# Patient Record
Sex: Female | Born: 1980 | Hispanic: No | Marital: Married | Smoking: Never smoker
Health system: Southern US, Community
[De-identification: ages and names within clinical notes are randomized; demographics above are authoritative.]

## PROBLEM LIST (undated history)

## (undated) DIAGNOSIS — Z789 Other specified health status: Secondary | ICD-10-CM

## (undated) HISTORY — PX: NO PAST SURGERIES: SHX2092

---

## 2013-04-13 NOTE — L&D Delivery Note (Signed)
Attestation of Attending Supervision of Advanced Practitioner (CNM/NP): Evaluation and management procedures were performed by the Advanced Practitioner under my supervision and collaboration. I have reviewed the Advanced Practitioner's note and chart, and I agree with the management and plan.  Creig Landin H Tinzley Dalia 7:28 AM   

## 2013-04-13 NOTE — L&D Delivery Note (Signed)
Delivery Note At 12:33 AM a viable female was delivered via Vaginal, Spontaneous Delivery (Presentation: LOA ).  APGAR: 8, 9; weight: pending. Nuchal x 2 with 1 loop easily reduced and the other, delivered through using 'somersault' maneuver. Infant dried and placed on pt's abd. Cord clamped and cut by FOB. Hospital cord blood sample collected.  Placenta status: Intact, Spontaneous.  Cord: 3 vessel.  Anesthesia: Epidural  Episiotomy: none Lacerations: none Est. Blood Loss (mL): 200  Mom to postpartum.  Baby to Couplet care / Skin to Skin.  Arabella MerlesKimberly D Kallum Jorgensen CNM 08/12/2013, 12:58 AM

## 2013-08-10 ENCOUNTER — Encounter (HOSPITAL_COMMUNITY): Payer: Self-pay

## 2013-08-10 ENCOUNTER — Inpatient Hospital Stay (HOSPITAL_COMMUNITY)
Admission: AD | Admit: 2013-08-10 | Discharge: 2013-08-13 | DRG: 775 | Disposition: A | Discharge status: Home / Self Care | Payer: Medicaid Other | Source: Ambulatory Visit | Attending: Obstetrics and Gynecology | Admitting: Obstetrics and Gynecology

## 2013-08-10 ENCOUNTER — Inpatient Hospital Stay (HOSPITAL_COMMUNITY): Payer: Medicaid Other

## 2013-08-10 HISTORY — DX: Other specified health status: Z78.9

## 2013-08-10 LAB — TYPE AND SCREEN
ABO/RH(D): O POS
ANTIBODY SCREEN: NEGATIVE

## 2013-08-10 LAB — DIFFERENTIAL
BASOS PCT: 0 % (ref 0–1)
Basophils Absolute: 0 10*3/uL (ref 0.0–0.1)
Eosinophils Absolute: 0.1 10*3/uL (ref 0.0–0.7)
Eosinophils Relative: 1 % (ref 0–5)
LYMPHS ABS: 2.4 10*3/uL (ref 0.7–4.0)
Lymphocytes Relative: 20 % (ref 12–46)
Monocytes Absolute: 0.8 10*3/uL (ref 0.1–1.0)
Monocytes Relative: 7 % (ref 3–12)
NEUTROS ABS: 8.8 10*3/uL — AB (ref 1.7–7.7)
NEUTROS PCT: 73 % (ref 43–77)

## 2013-08-10 LAB — CBC
HCT: 36.9 % (ref 36.0–46.0)
Hemoglobin: 12.6 g/dL (ref 12.0–15.0)
MCH: 29.4 pg (ref 26.0–34.0)
MCHC: 34.1 g/dL (ref 30.0–36.0)
MCV: 86.2 fL (ref 78.0–100.0)
Platelets: 183 10*3/uL (ref 150–400)
RBC: 4.28 MIL/uL (ref 3.87–5.11)
RDW: 13.5 % (ref 11.5–15.5)
WBC: 12.1 10*3/uL — ABNORMAL HIGH (ref 4.0–10.5)

## 2013-08-10 LAB — RAPID HIV SCREEN (WH-MAU): Rapid HIV Screen: NONREACTIVE

## 2013-08-10 LAB — WET PREP, GENITAL
CLUE CELLS WET PREP: NONE SEEN
Trich, Wet Prep: NONE SEEN
Yeast Wet Prep HPF POC: NONE SEEN

## 2013-08-10 LAB — AMNISURE RUPTURE OF MEMBRANE (ROM) NOT AT ARMC: AMNISURE: NEGATIVE

## 2013-08-10 LAB — OB RESULTS CONSOLE GC/CHLAMYDIA
CHLAMYDIA, DNA PROBE: NEGATIVE
Gonorrhea: NEGATIVE

## 2013-08-10 LAB — OB RESULTS CONSOLE GBS: STREP GROUP B AG: NEGATIVE

## 2013-08-10 LAB — GROUP B STREP BY PCR: Group B strep by PCR: NEGATIVE

## 2013-08-10 LAB — OB RESULTS CONSOLE HIV ANTIBODY (ROUTINE TESTING): HIV: NONREACTIVE

## 2013-08-10 LAB — RPR

## 2013-08-10 LAB — ABO/RH: ABO/RH(D): O POS

## 2013-08-10 LAB — HEPATITIS B SURFACE ANTIGEN: HEP B S AG: NEGATIVE

## 2013-08-10 MED ORDER — ONDANSETRON HCL 4 MG/2ML IJ SOLN
4.0000 mg | Freq: Four times a day (QID) | INTRAMUSCULAR | Status: DC | PRN
Start: 2013-08-10 — End: 2013-08-12
  Administered 2013-08-11: 4 mg via INTRAVENOUS
  Filled 2013-08-10: qty 2

## 2013-08-10 MED ORDER — FENTANYL CITRATE 0.05 MG/ML IJ SOLN
50.0000 ug | INTRAMUSCULAR | Status: DC | PRN
  Administered 2013-08-10: 50 ug via INTRAVENOUS
  Filled 2013-08-10: qty 2

## 2013-08-10 MED ORDER — TERBUTALINE SULFATE 1 MG/ML IJ SOLN: 0.2500 mg | Freq: Once | INTRAMUSCULAR | Status: AC | PRN

## 2013-08-10 MED ORDER — ACETAMINOPHEN 325 MG PO TABS: 650.0000 mg | ORAL_TABLET | ORAL | Status: DC | PRN

## 2013-08-10 MED ORDER — LIDOCAINE HCL (PF) 1 % IJ SOLN
30.0000 mL | INTRAMUSCULAR | Status: DC | PRN
  Filled 2013-08-10: qty 30

## 2013-08-10 MED ORDER — OXYTOCIN BOLUS FROM INFUSION: 500.0000 mL | INTRAVENOUS | Status: DC

## 2013-08-10 MED ORDER — FLEET ENEMA 7-19 GM/118ML RE ENEM
1.0000 | ENEMA | RECTAL | Status: DC | PRN
Start: 2013-08-10 — End: 2013-08-12

## 2013-08-10 MED ORDER — IBUPROFEN 600 MG PO TABS: 600.0000 mg | ORAL_TABLET | Freq: Four times a day (QID) | ORAL | Status: DC | PRN

## 2013-08-10 MED ORDER — ZOLPIDEM TARTRATE 5 MG PO TABS: 5.0000 mg | ORAL_TABLET | Freq: Every evening | ORAL | Status: DC | PRN

## 2013-08-10 MED ORDER — DIPHENHYDRAMINE HCL 50 MG/ML IJ SOLN: 12.5000 mg | INTRAMUSCULAR | Status: DC | PRN

## 2013-08-10 MED ORDER — LACTATED RINGERS IV SOLN
500.0000 mL | INTRAVENOUS | Status: DC | PRN
  Administered 2013-08-11 (×2): 500 mL via INTRAVENOUS

## 2013-08-10 MED ORDER — OXYTOCIN 40 UNITS IN LACTATED RINGERS INFUSION - SIMPLE MED
62.5000 mL/h | INTRAVENOUS | Status: DC
  Filled 2013-08-10: qty 1000

## 2013-08-10 MED ORDER — EPHEDRINE 5 MG/ML INJ
10.0000 mg | INTRAVENOUS | Status: DC | PRN
  Filled 2013-08-10: qty 2
  Filled 2013-08-10: qty 4

## 2013-08-10 MED ORDER — OXYCODONE-ACETAMINOPHEN 5-325 MG PO TABS: 1.0000 | ORAL_TABLET | ORAL | Status: DC | PRN

## 2013-08-10 MED ORDER — PHENYLEPHRINE 40 MCG/ML (10ML) SYRINGE FOR IV PUSH (FOR BLOOD PRESSURE SUPPORT)
80.0000 ug | PREFILLED_SYRINGE | INTRAVENOUS | Status: DC | PRN
  Filled 2013-08-10: qty 2

## 2013-08-10 MED ORDER — CITRIC ACID-SODIUM CITRATE 334-500 MG/5ML PO SOLN
30.0000 mL | ORAL | Status: DC | PRN
  Filled 2013-08-10: qty 15

## 2013-08-10 MED ORDER — LACTATED RINGERS IV SOLN
INTRAVENOUS | Status: DC
  Administered 2013-08-10 – 2013-08-11 (×3): via INTRAVENOUS

## 2013-08-10 MED ORDER — EPHEDRINE 5 MG/ML INJ
10.0000 mg | INTRAVENOUS | Status: DC | PRN
  Filled 2013-08-10: qty 4
  Filled 2013-08-10: qty 2

## 2013-08-10 MED ORDER — LACTATED RINGERS IV SOLN: 500.0000 mL | Freq: Once | INTRAVENOUS | Status: DC

## 2013-08-10 MED ORDER — FENTANYL 2.5 MCG/ML BUPIVACAINE 1/10 % EPIDURAL INFUSION (WH - ANES)
14.0000 mL/h | INTRAMUSCULAR | Status: DC | PRN
  Administered 2013-08-11: 14 mL/h via EPIDURAL
  Filled 2013-08-10 (×2): qty 125

## 2013-08-10 MED ORDER — PHENYLEPHRINE 40 MCG/ML (10ML) SYRINGE FOR IV PUSH (FOR BLOOD PRESSURE SUPPORT)
80.0000 ug | PREFILLED_SYRINGE | INTRAVENOUS | Status: DC | PRN
  Filled 2013-08-10: qty 10
  Filled 2013-08-10: qty 2

## 2013-08-10 NOTE — Progress Notes (Signed)
   Christine KassHebah Sheppard is a 33 y.o. G1P0 at 4638w2d  admitted for induction of labor due to several fetal decels, double nuchal cord.  Subjective:  Not feeling any contractions Objective: BP 128/75  Pulse 72  Temp(Src) 98 F (36.7 C) (Oral)  Resp 18  Ht 5' 3.39" (1.61 m)  Wt 76.658 kg (169 lb)  BMI 29.57 kg/m2  SpO2 100%    FHT:  FHR: 130 bpm, variability: moderate,  accelerations:  Present,  decelerations:  Absent UC:   irregular, every 2-5 minutes SVE:   Dilation: 1 Effacement (%): Thick Station: -2 Exam by:: fran cresenzio cnm  US today gives GA of 35.2 weeks; however, pt is "very, very sure" of LMP and states that she was given an EDC of May 5-20 in [place], so will not manage as if preterm.  Labs: Lab Results  Component Value Date   WBC 12.1* 08/10/2013   HGB 12.6 08/10/2013   HCT 36.9 08/10/2013   MCV 86.2 08/10/2013   PLT 183 08/10/2013    Assessment / Plan: IOL for 2 prolonged bradycardic episodes GBS pending FOLEY inserted and inflated with 60cc H20 Labor: ripening phase Fetal Wellbeing:  Category I Pain Control:  Labor support without medications Anticipated MOD:  NSVD  Christine ShellFrances Sheppard 08/10/2013, 10:06 PM

## 2013-08-10 NOTE — MAU Provider Note (Signed)
None     Chief Complaint:  No chief complaint on file.   Christine Sheppard is  33 y.o. G1P0 at 345w2d presents complaining of vaginal leakage, and increased fetal movement this morning. Pt states that she was evaluated at [facility] for Vaginal bleeding 10d ago but has not had bleeding since that time. No significant contractions. While on monitor in MAU pt had a prolonged decel to 80s that slowly recovered with fluids and O2.  Of note pt states that her LMP is Nov 15 2012 and has received her prenatal care in [place]. Pt does not have any of her prenatal records. Pt reports a normal prenatal course and is only taking PNV.   Obstetrical/Gynecological History: OB History   Grav Para Term Preterm Abortions TAB SAB Ect Mult Living   1              Past Medical History: Past Medical History  Diagnosis Date  . Medical history non-contributory     Past Surgical History: Past Surgical History  Procedure Laterality Date  . No past surgeries      Family History: Family History  Problem Relation Age of Onset  . Alcohol abuse Neg Hx   . Arthritis Neg Hx   . Asthma Neg Hx   . Birth defects Neg Hx   . Cancer Neg Hx   . COPD Neg Hx   . Depression Neg Hx   . Diabetes Neg Hx   . Drug abuse Neg Hx   . Early death Neg Hx   . Hearing loss Neg Hx   . Heart disease Neg Hx   . Hyperlipidemia Neg Hx   . Hypertension Neg Hx   . Kidney disease Neg Hx   . Learning disabilities Neg Hx   . Mental illness Neg Hx   . Mental retardation Neg Hx   . Miscarriages / Stillbirths Neg Hx   . Stroke Neg Hx   . Vision loss Neg Hx     Social History: History  Substance Use Topics  . Smoking status: Never Smoker   . Smokeless tobacco: Not on file  . Alcohol Use: No    Allergies: No Known Allergies  Meds:  Prescriptions prior to admission  Medication Sig Dispense Refill  . Prenatal Vit-Fe Fumarate-FA (PRENATAL MULTIVITAMIN) TABS tablet Take 1 tablet by mouth daily at 12 noon.        Review of  Systems -   Review of Systems  Denies HA, vision changes, CP, SOB, n/v, d/c, f/c, or other complaints.      Physical Exam  Blood pressure 128/75, pulse 72, temperature 98 F (36.7 C), temperature source Oral, resp. rate 18, height 5' 3.39" (1.61 m), weight 76.658 kg (169 lb), SpO2 100.00%. GENERAL: Well-developed, well-nourished female in no acute distress.  LUNGS: Clear to auscultation bilaterally.  HEART: Regular rate and rhythm. ABDOMEN: Soft, nontender, nondistended, gravid.  EXTREMITIES: Nontender, no edema, 2+ distal pulses. DTR's 2+  Dilation: 1 Effacement (%): Thick Cervical Position: Posterior Station: -2 Presentation: Vertex Exam by:: fran cresenzio cnm  SSE: White vag Discharge  Presentation: cephalic FHT:  Baseline rate 150s bpm   Variability moderate  Accelerations present   Decelerations one prolonged decel. Contractions: Every  mins   Labs: Results for orders placed during the hospital encounter of 08/10/13 (from the past 24 hour(s))  WET PREP, GENITAL   Collection Time    08/10/13  3:59 PM      Result Value Ref Range  Yeast Wet Prep HPF POC NONE SEEN  NONE SEEN   Trich, Wet Prep NONE SEEN  NONE SEEN   Clue Cells Wet Prep HPF POC NONE SEEN  NONE SEEN   WBC, Wet Prep HPF POC FEW (*) NONE SEEN  HEPATITIS B SURFACE ANTIGEN   Collection Time    08/10/13  4:10 PM      Result Value Ref Range   Hepatitis B Surface Ag NEGATIVE  NEGATIVE  RPR   Collection Time    08/10/13  4:10 PM      Result Value Ref Range   RPR NON REAC  NON REAC  CBC   Collection Time    08/10/13  4:10 PM      Result Value Ref Range   WBC 12.1 (*) 4.0 - 10.5 K/uL   RBC 4.28  3.87 - 5.11 MIL/uL   Hemoglobin 12.6  12.0 - 15.0 g/dL   HCT 54.036.9  98.136.0 - 19.146.0 %   MCV 86.2  78.0 - 100.0 fL   MCH 29.4  26.0 - 34.0 pg   MCHC 34.1  30.0 - 36.0 g/dL   RDW 47.813.5  29.511.5 - 62.115.5 %   Platelets 183  150 - 400 K/uL  DIFFERENTIAL   Collection Time    08/10/13  4:10 PM      Result Value Ref Range    Neutrophils Relative % 73  43 - 77 %   Neutro Abs 8.8 (*) 1.7 - 7.7 K/uL   Lymphocytes Relative 20  12 - 46 %   Lymphs Abs 2.4  0.7 - 4.0 K/uL   Monocytes Relative 7  3 - 12 %   Monocytes Absolute 0.8  0.1 - 1.0 K/uL   Eosinophils Relative 1  0 - 5 %   Eosinophils Absolute 0.1  0.0 - 0.7 K/uL   Basophils Relative 0  0 - 1 %   Basophils Absolute 0.0  0.0 - 0.1 K/uL  RAPID HIV SCREEN [facility])   Collection Time    08/10/13  4:10 PM      Result Value Ref Range   SUDS Rapid HIV Screen NON REACTIVE  NON REACTIVE  AMNISURE RUPTURE OF MEMBRANE (ROM)   Collection Time    08/10/13  4:22 PM      Result Value Ref Range   Amnisure ROM NEGATIVE    TYPE AND SCREEN   Collection Time    08/10/13  5:30 PM      Result Value Ref Range   ABO/RH(D) O POS     Antibody Screen NEG     Sample Expiration 08/13/2013    ABO/RH   Collection Time    08/10/13  5:30 PM      Result Value Ref Range   ABO/RH(D) O POS     Imaging Studies:  No results found.  Assessment: Christine Sheppard is  33 y.o. G1P0 at 2758w2d presents with eval for ROM and increased Fetal movement.  - Neg ROM - Prolonged decel  -- BPP Pending - PNL collected, no prenatal care in [place]. -- Message sent to clinic to establish care.  Pt signed out to oncoming provider at 1700.  Scarlette CalicoFrances Cresenzo-Dishmon 4/30/201510:15 PM  Cord dopplers normal and BPP still unavailable.  However, pt had another 10 minute deceleration of the FHR. Dr. Penne LashLeggett responded and advised IOL. PT agrees.

## 2013-08-10 NOTE — MAU Note (Signed)
Pt states she has been feeling the baby move more than usual

## 2013-08-10 NOTE — Progress Notes (Signed)
This note also relates to the following rows which could not be included: Pulse Rate - Cannot attach notes to unvalidated device data SpO2 - Cannot attach notes to unvalidated device data   Pt had deceleration down to 50 bpm recovering after to 130 bpm

## 2013-08-10 NOTE — MAU Note (Signed)
Patient states she has recently (2 weeks ago) flown from [place]. States that during that flight she has some bleeding and leaking fluid and was checked by a MD on the flight and was determined to be OK. States she was advised to not return to try to deliver. States she will deliver here. States she feels the baby is moving to much today. No pain, bleeding or leaking today.

## 2013-08-10 NOTE — Progress Notes (Addendum)
This note also relates to the following rows which could not be included: Pulse Rate - Cannot attach notes to unvalidated device data SpO2 - Cannot attach notes to unvalidated device data   Deceleration noted Dr Ike Benedom called to the room pt turned to her side O2  Infusing IV started. Dr. Penne LashLeggett also at bedside Pulse  Oximeter in place.

## 2013-08-10 NOTE — Progress Notes (Signed)
This note also relates to the following rows which could not be included: Pulse Rate - Cannot attach notes to unvalidated device data SpO2 - Cannot attach notes to unvalidated device data   Pt had a deceleration to  50  O2 masked replaced and O2 bolus started   Pt turned to left side and then to right side pt placed in knee chest position FHR returned   back to baseline 120   After 6 minutes.

## 2013-08-10 NOTE — Progress Notes (Signed)
Updated Drenda FreezeFran CNM about mild UC pattern, room ready for foley bulb insertion, GBS needs to be drawn.

## 2013-08-11 ENCOUNTER — Encounter (HOSPITAL_COMMUNITY): Payer: Self-pay | Admitting: *Deleted

## 2013-08-11 ENCOUNTER — Encounter (HOSPITAL_COMMUNITY): Payer: Medicaid Other | Admitting: Anesthesiology

## 2013-08-11 ENCOUNTER — Inpatient Hospital Stay (HOSPITAL_COMMUNITY): Payer: Medicaid Other | Admitting: Anesthesiology

## 2013-08-11 LAB — RUBELLA SCREEN: Rubella: 0.81 Index (ref <0.90)

## 2013-08-11 LAB — GC/CHLAMYDIA PROBE AMP
CT PROBE, AMP APTIMA: NEGATIVE
GC PROBE AMP APTIMA: NEGATIVE

## 2013-08-11 MED ORDER — LIDOCAINE HCL (PF) 1 % IJ SOLN
INTRAMUSCULAR | Status: DC | PRN
  Administered 2013-08-11 (×2): 4 mL

## 2013-08-11 MED ORDER — TERBUTALINE SULFATE 1 MG/ML IJ SOLN: 0.2500 mg | Freq: Once | INTRAMUSCULAR | Status: AC | PRN

## 2013-08-11 MED ORDER — SODIUM CHLORIDE 0.9 % IV SOLN: 14.0000 mL/h | INTRAVENOUS | Status: DC | PRN

## 2013-08-11 MED ORDER — LACTATED RINGERS IV SOLN: INTRAVENOUS | Status: DC

## 2013-08-11 MED ORDER — OXYTOCIN 40 UNITS IN LACTATED RINGERS INFUSION - SIMPLE MED
1.0000 m[IU]/min | INTRAVENOUS | Status: DC
  Administered 2013-08-11: 2 m[IU]/min via INTRAVENOUS

## 2013-08-11 MED ORDER — FENTANYL 2.5 MCG/ML BUPIVACAINE 1/10 % EPIDURAL INFUSION (WH - ANES)
INTRAMUSCULAR | Status: DC | PRN
  Administered 2013-08-11: 14 mL/h via EPIDURAL

## 2013-08-11 NOTE — Anesthesia Procedure Notes (Signed)
Epidural Patient location during procedure: OB Start time: 08/11/2013 11:00 AM  Staffing Anesthesiologist: Mallie Giambra A. Performed by: anesthesiologist   Preanesthetic Checklist Completed: patient identified, site marked, surgical consent, pre-op evaluation, timeout performed, IV checked, risks and benefits discussed and monitors and equipment checked  Epidural Patient position: left lateral decubitus Prep: site prepped and draped and DuraPrep Patient monitoring: continuous pulse ox and blood pressure Approach: midline Location: L4-L5 Injection technique: LOR air  Needle:  Needle type: Tuohy  Needle gauge: 17 G Needle length: 9 cm and 9 Needle insertion depth: 6 cm Catheter type: closed end flexible Catheter size: 19 Gauge Catheter at skin depth: 11 cm Test dose: negative and Other  Assessment Events: blood not aspirated, injection not painful, no injection resistance, negative IV test and no paresthesia  Additional Notes Patient identified. Risks and benefits discussed including failed block, incomplete  Pain control, post dural puncture headache, nerve damage, paralysis, blood pressure Changes, nausea, vomiting, reactions to medications-both toxic and allergic and post Partum back pain. All questions were answered. Patient expressed understanding and wished to proceed. Sterile technique was used throughout procedure. Epidural site was Dressed with sterile barrier dressing. No paresthesias, signs of intravascular injection Or signs of intrathecal spread were encountered.  Patient was more comfortable after the epidural was dosed. Please see RN's note for documentation of vital signs and FHR which are stable.

## 2013-08-11 NOTE — H&P (Signed)
Author: [name], CNM Service: Obstetrics Author Type: Certified Nurse Midwife      Filed: 08/10/2013 10:15 PM Note Time: 08/10/2013  4:09 PM Note Type: MAU Provider Note      Status: Cosign Needed Editor: [name], CNM (Certified Nurse Midwife)      Related Notes: Original Note by Minta Balsam, MD (Physician) filed at 08/10/2013  5:00 PM      Cosign Required: Yes               None       Chief Complaint:  No chief complaint on file.     Christine Sheppard is  33 y.o. G1P0 at [redacted]w[redacted]d presents complaining of vaginal leakage, and increased fetal movement this morning. Pt states that she was evaluated at [facility] for Vaginal bleeding 10d ago but has not had bleeding since that time. No significant contractions. While on monitor in MAU pt had a prolonged decel to 80s that slowly recovered with fluids and O2.   Of note pt states that her LMP is Nov 15 2012 and has received her prenatal care in [place]. Pt does not have any of her prenatal records. Pt reports a normal prenatal course and is only taking PNV.    Obstetrical/Gynecological History: OB History     Grav  Para  Term  Preterm  Abortions  TAB  SAB  Ect  Mult  Living     1                           Past Medical History: Past Medical History   Diagnosis  Date   .  Medical history non-contributory          Past Surgical History: Past Surgical History   Procedure  Laterality  Date   .  No past surgeries            Family History: Family History   Problem  Relation  Age of Onset   .  Alcohol abuse  Neg Hx     .  Arthritis  Neg Hx     .  Asthma  Neg Hx     .  Birth defects  Neg Hx     .  Cancer  Neg Hx     .  COPD  Neg Hx     .  Depression  Neg Hx     .  Diabetes  Neg Hx     .  Drug abuse  Neg Hx     .  Early death  Neg Hx     .  Hearing loss  Neg Hx     .  Heart disease  Neg Hx     .  Hyperlipidemia  Neg Hx     .  Hypertension  Neg Hx     .  Kidney disease  Neg Hx     .  Learning  disabilities  Neg Hx     .  Mental illness  Neg Hx     .  Mental retardation  Neg Hx     .  Miscarriages / Stillbirths  Neg Hx     .  Stroke  Neg Hx     .  Vision loss  Neg Hx          Social History: History   Substance Use Topics   .  Smoking status:  Never Smoker    .  Smokeless tobacco:  Not on file   .  Alcohol Use:  No        Allergies: No Known Allergies   Meds:   Prescriptions prior to admission   Medication  Sig  Dispense  Refill   .  Prenatal Vit-Fe Fumarate-FA (PRENATAL MULTIVITAMIN) TABS tablet  Take 1 tablet by mouth daily at 12 noon.              Review of Systems -    Review of Systems  Denies HA, vision changes, CP, SOB, n/v, d/c, f/c, or other complaints.          Physical Exam    Blood pressure 128/75, pulse 72, temperature 98 F (36.7 C), temperature source Oral, resp. rate 18, height 5' 3.39" (1.61 m), weight 76.658 kg (169 lb), SpO2 100.00%. GENERAL: Well-developed, well-nourished female in no acute distress.   LUNGS: Clear to auscultation bilaterally.   HEART: Regular rate and rhythm. ABDOMEN: Soft, nontender, nondistended, gravid.   EXTREMITIES: Nontender, no edema, 2+ distal pulses. DTR's 2+   Dilation: 1 Effacement (%): Thick Cervical Position: Posterior Station: -2 Presentation: Vertex Exam by:: fran cresenzio cnm   SSE: White vag Discharge   Presentation: cephalic FHT:  Baseline rate 150s bpm   Variability moderate  Accelerations present   Decelerations one prolonged decel. Contractions: Every  mins    Labs: Results for orders placed during the hospital encounter of 08/10/13 (from the past 24 hour(s))   WET PREP, GENITAL     Collection Time      08/10/13  3:59 PM       Result  Value  Ref Range     Yeast Wet Prep HPF POC  NONE SEEN   NONE SEEN     Trich, Wet Prep  NONE SEEN   NONE SEEN     Clue Cells Wet Prep HPF POC  NONE SEEN   NONE SEEN     WBC, Wet Prep HPF POC  FEW (*)  NONE SEEN   HEPATITIS B SURFACE  ANTIGEN     Collection Time      08/10/13  4:10 PM       Result  Value  Ref Range     Hepatitis B Surface Ag  NEGATIVE   NEGATIVE   RPR     Collection Time      08/10/13  4:10 PM       Result  Value  Ref Range     RPR  NON REAC   NON REAC   CBC     Collection Time      08/10/13  4:10 PM       Result  Value  Ref Range     WBC  12.1 (*)  4.0 - 10.5 K/uL     RBC  4.28   3.87 - 5.11 MIL/uL     Hemoglobin  12.6   12.0 - 15.0 g/dL     HCT  16.136.9   09.636.0 - 46.0 %     MCV  86.2   78.0 - 100.0 fL     MCH  29.4   26.0 - 34.0 pg     MCHC  34.1   30.0 - 36.0 g/dL     RDW  04.513.5   40.911.5 - 15.5 %     Platelets  183   150 - 400 K/uL   DIFFERENTIAL     Collection Time      08/10/13  4:10 PM  Result  Value  Ref Range     Neutrophils Relative %  73   43 - 77 %     Neutro Abs  8.8 (*)  1.7 - 7.7 K/uL     Lymphocytes Relative  20   12 - 46 %     Lymphs Abs  2.4   0.7 - 4.0 K/uL     Monocytes Relative  7   3 - 12 %     Monocytes Absolute  0.8   0.1 - 1.0 K/uL     Eosinophils Relative  1   0 - 5 %     Eosinophils Absolute  0.1   0.0 - 0.7 K/uL     Basophils Relative  0   0 - 1 %     Basophils Absolute  0.0   0.0 - 0.1 K/uL   RAPID HIV SCREEN [facility])     Collection Time      08/10/13  4:10 PM       Result  Value  Ref Range     SUDS Rapid HIV Screen  NON REACTIVE   NON REACTIVE   AMNISURE RUPTURE OF MEMBRANE (ROM)     Collection Time      08/10/13  4:22 PM       Result  Value  Ref Range     Amnisure ROM  NEGATIVE      TYPE AND SCREEN     Collection Time      08/10/13  5:30 PM       Result  Value  Ref Range     ABO/RH(D)  O POS        Antibody Screen  NEG        Sample Expiration  08/13/2013      ABO/RH     Collection Time      08/10/13  5:30 PM       Result  Value  Ref Range     ABO/RH(D)  O POS         Imaging Studies:  No results found.   Assessment: Christine Sheppard is  33 y.o. G1P0 at 5234w2d presents with eval for ROM and increased Fetal movement.   - Neg ROM - Prolonged  decel   -- BPP Pending - PNL collected, no prenatal care in [place]. -- Message sent to clinic to establish care.   Pt signed out to oncoming provider at 1700.   Scarlette CalicoFrances Cresenzo-Dishmon 4/30/201510:15 PM   Cord dopplers normal and BPP still unavailable.  However, pt had another 10 minute deceleration of the FHR. Dr. Penne LashLeggett responded and advised IOL. PT agrees.    Pt seen and examined.  Agree with above. Lesly DukesKelly H Leggett

## 2013-08-11 NOTE — Progress Notes (Signed)
   Christine KassHebah Sheppard is a 33 y.o. G1P0 at 3954w2d  admitted for induction of labor due to several fetal decels  Subjective: Comfortable s/p epidural. Does not feel contractions. Pleasant without questions.  Objective: BP 111/71  Pulse 85  Temp(Src) 98.2 F (36.8 C) (Oral)  Resp 18  Ht 5' 3.39" (1.61 m)  Wt 76.658 kg (169 lb)  BMI 29.57 kg/m2  SpO2 100%  LMP 11/15/2012     FHT:  FHR: 140 bpm, variability: moderate,  accelerations:  Present,  decelerations:  Prolonged decel (1040 to 1046) fetal brady down to 60, return to baseline 140 (hands / knee position, O2, IV bolus) UC:   Regular q 2 min SVE: Dilation: 5 Effacement (%): 60 Cervical Position: Middle Station: 0 Presentation: Vertex Exam by:: Felkel,rn (Dr. Althea CharonKaramalegos)   Labs: Lab Results  Component Value Date   WBC 12.1* 08/10/2013   HGB 12.6 08/10/2013   HCT 36.9 08/10/2013   MCV 86.2 08/10/2013   PLT 183 08/10/2013    Assessment / Plan: IOL for 2 prolonged bradycardic episodes / decels - 3rd prolonged decel today 1040 to 1046, resolved with hands-knees position, IV bolus, supplemental O2.  Labor: IOL: s/p FB, progressing well on Pitocin currently on 6 milu/min. Unlikely AROM until significant progression, due to benefit of fluid protecting cord with significant decels. Fetal Wellbeing:  Category I Pain Control:  Epidural Anticipated MOD:  NSVD  Christine PilarAlexander Karamalegos, Christine Sheppard [facility], PGY-1 08/11/2013, 3:47 PM  I reviewed and agree with above documentation.    Christine HavenKeli L Ja Pistole, MD

## 2013-08-11 NOTE — Progress Notes (Signed)
   Christine Sheppard is a 33 y.o. G1P0 at 284w2d  admitted for induction of labor due to several fetal decels  Subjective: Feeling fine, not feeling contractions Objective: BP 97/51  Pulse 85  Temp(Src) 98.1 F (36.7 C) (Oral)  Resp 18  Ht 5' 3.39" (1.61 m)  Wt 76.658 kg (169 lb)  BMI 29.57 kg/m2  SpO2 100%  LMP 11/15/2012     FHT:  FHR: 130 bpm, variability: moderate,  accelerations:  Present,  decelerations:  Absent UC:   irregular, every 2-5 minutes SVE:  Deferred: FB stil in place  Labs: Lab Results  Component Value Date   WBC 12.1* 08/10/2013   HGB 12.6 08/10/2013   HCT 36.9 08/10/2013   MCV 86.2 08/10/2013   PLT 183 08/10/2013    Assessment / Plan: IOL for 2 prolonged bradycardic episodes Will allow a light breakfast/shower Labor: ripening phase Fetal Wellbeing:  Category I Pain Control:  Labor support without medications Anticipated MOD:  NSVD  Christine ShellFrances Sheppard 08/11/2013, 7:29 AM

## 2013-08-11 NOTE — MAU Provider Note (Signed)
Attestation of Attending Supervision of Advanced Practitioner (CNM/NP): Evaluation and management procedures were performed by the Advanced Practitioner under my supervision and collaboration. I have reviewed the Advanced Practitioner's note and chart, and I agree with the management and plan.  Lesly DukesKelly H Jonerik Sliker 6:28 AM

## 2013-08-11 NOTE — Progress Notes (Signed)
Christine Sheppard is a 33 y.o. G1P0 at 3122w3d   Subjective: Comfortable with epidural; had mod early onset variables starting a little before 8p for which amnioinfusion was started  Objective: BP 110/57  Pulse 92  Temp(Src) 98.5 F (36.9 C) (Oral)  Resp 18  Ht 5' 3.39" (1.61 m)  Wt 76.658 kg (169 lb)  BMI 29.57 kg/m2  SpO2 100%  LMP 11/15/2012 I/O last 3 completed shifts: In: -  Out: 1200 [Urine:1200]    FHT:  FHR: 130s bpm, variability: moderate,  accelerations:  Present,  decelerations:  Absent UC:   regular, every 2-4 minutes without Pitocin; MVUs adequate SVE:  7/90/0- exam deferred at present Labs: Lab Results  Component Value Date   WBC 12.1* 08/10/2013   HGB 12.6 08/10/2013   HCT 36.9 08/10/2013   MCV 86.2 08/10/2013   PLT 183 08/10/2013    Assessment / Plan: Active labor FHR changes but stable now  Will continue amnioinfusion and watch FHR closely- suspect FHR changes were due to rapid cx change/descent Anticipate SVD  Christine Sheppard 08/11/2013, 9:47 PM

## 2013-08-11 NOTE — Progress Notes (Signed)
   Kerry KassHebah Fennel is a 33 y.o. G1P0 at 2748w2d  admitted for induction of labor due to several fetal decels, double nuchal cord.  Subjective: Sleeping  Objective: BP 97/51  Pulse 85  Temp(Src) 98.1 F (36.7 C) (Oral)  Resp 18  Ht 5' 3.39" (1.61 m)  Wt 76.658 kg (169 lb)  BMI 29.57 kg/m2  SpO2 100%  LMP 11/15/2012   GBS negative  FHT:  FHR: 130 bpm, variability: moderate,  accelerations:  Present,  decelerations:  Absent UC:   irregular, every 2-5 minutes SVE:  Deferred: FB stil in place  Labs: Lab Results  Component Value Date   WBC 12.1* 08/10/2013   HGB 12.6 08/10/2013   HCT 36.9 08/10/2013   MCV 86.2 08/10/2013   PLT 183 08/10/2013    Assessment / Plan: IOL for 2 prolonged bradycardic episodes  Labor: ripening phase Fetal Wellbeing:  Category I Pain Control:  Labor support without medications Anticipated MOD:  NSVD  Jacklyn ShellFrances Cresenzo-Dishmon 08/11/2013, 4:43 AM

## 2013-08-11 NOTE — Progress Notes (Signed)
Christine Sheppard is a 33 y.o. G1P0 at 3893w3d admitted for induction of labor due to decels.  Subjective: Comfortable with epidural. No complaints. +FM.    Objective: BP 111/61  Pulse 88  Temp(Src) 98.2 F (36.8 C) (Oral)  Resp 20  Ht 5' 3.39" (1.61 m)  Wt 76.658 kg (169 lb)  BMI 29.57 kg/m2  SpO2 100%  LMP 11/15/2012 I/O last 3 completed shifts: In: -  Out: 1200 [Urine:1200]    FHT:  FHR: 125 bpm, variability: moderate,  accelerations:  Present,  decelerations:  Absent UC:   irregular, every 2-4 minutes SVE:   Dilation: 4.5 Effacement (%): 60 Station: 0 Exam by:: dr. Reola Sheppard  Labs: Lab Results  Component Value Date   WBC 12.1* 08/10/2013   HGB 12.6 08/10/2013   HCT 36.9 08/10/2013   MCV 86.2 08/10/2013   PLT 183 08/10/2013    Assessment / Plan: IOL for decels. slow progression since fb came out.    Labor: no real change on exam for me. AROM performed with return of clear fluid. IUPC placed in case we need to do an amnioinfusion.  Initially baby with deep variables with contractions but also MVUs >300 and after decreasing pit to 635mu/min marked improvement in fetal status.  Fetal Wellbeing:  cat I prior to AROM and then cat II with rapid improvement to cat I after decreasing pitocin Pain Control:  Epidural I/D:  n/a Anticipated MOD:  NSVD  Christine Sheppard Christine Sheppard 08/11/2013, 7:31 PM

## 2013-08-11 NOTE — Anesthesia Preprocedure Evaluation (Addendum)
Anesthesia Evaluation  Patient identified by MRN, date of birth, ID band Patient awake    Reviewed: Allergy & Precautions, H&P , NPO status , Patient's Chart, lab work & pertinent test results  Airway Mallampati: III TM Distance: >3 FB Neck ROM: Full    Dental no notable dental hx. (+) Teeth Intact   Pulmonary neg pulmonary ROS,  breath sounds clear to auscultation  Pulmonary exam normal       Cardiovascular negative cardio ROS  Rhythm:Regular Rate:Normal     Neuro/Psych negative neurological ROS  negative psych ROS   GI/Hepatic negative GI ROS, Neg liver ROS,   Endo/Other  negative endocrine ROS  Renal/GU negative Renal ROS  negative genitourinary   Musculoskeletal negative musculoskeletal ROS (+)   Abdominal   Peds  Hematology negative hematology ROS (+)   Anesthesia Other Findings   Reproductive/Obstetrics (+) Pregnancy Double nuchal cord                          Anesthesia Physical Anesthesia Plan  ASA: II  Anesthesia Plan: Epidural   Post-op Pain Management:    Induction:   Airway Management Planned: Natural Airway  Additional Equipment:   Intra-op Plan:   Post-operative Plan:   Informed Consent: I have reviewed the patients History and Physical, chart, labs and discussed the procedure including the risks, benefits and alternatives for the proposed anesthesia with the patient or authorized representative who has indicated his/her understanding and acceptance.     Plan Discussed with: Anesthesiologist  Anesthesia Plan Comments:         Anesthesia Quick Evaluation

## 2013-08-12 ENCOUNTER — Encounter (HOSPITAL_COMMUNITY): Payer: Self-pay | Admitting: *Deleted

## 2013-08-12 DIAGNOSIS — O368390 Maternal care for abnormalities of the fetal heart rate or rhythm, unspecified trimester, not applicable or unspecified: Secondary | ICD-10-CM

## 2013-08-12 MED ORDER — ZOLPIDEM TARTRATE 5 MG PO TABS: 5.0000 mg | ORAL_TABLET | Freq: Every evening | ORAL | Status: DC | PRN

## 2013-08-12 MED ORDER — DIPHENHYDRAMINE HCL 25 MG PO CAPS: 25.0000 mg | ORAL_CAPSULE | Freq: Four times a day (QID) | ORAL | Status: DC | PRN

## 2013-08-12 MED ORDER — PRENATAL MULTIVITAMIN CH
1.0000 | ORAL_TABLET | Freq: Every day | ORAL | Status: DC
  Administered 2013-08-12 – 2013-08-13 (×2): 1 via ORAL
  Filled 2013-08-12 (×2): qty 1

## 2013-08-12 MED ORDER — ONDANSETRON HCL 4 MG/2ML IJ SOLN: 4.0000 mg | INTRAMUSCULAR | Status: DC | PRN

## 2013-08-12 MED ORDER — ONDANSETRON HCL 4 MG PO TABS: 4.0000 mg | ORAL_TABLET | ORAL | Status: DC | PRN

## 2013-08-12 MED ORDER — SENNOSIDES-DOCUSATE SODIUM 8.6-50 MG PO TABS
2.0000 | ORAL_TABLET | ORAL | Status: DC
  Administered 2013-08-12: 2 via ORAL
  Filled 2013-08-12: qty 2

## 2013-08-12 MED ORDER — OXYCODONE-ACETAMINOPHEN 5-325 MG PO TABS: 1.0000 | ORAL_TABLET | ORAL | Status: DC | PRN

## 2013-08-12 MED ORDER — LANOLIN HYDROUS EX OINT: TOPICAL_OINTMENT | CUTANEOUS | Status: DC | PRN

## 2013-08-12 MED ORDER — BENZOCAINE-MENTHOL 20-0.5 % EX AERO
1.0000 | INHALATION_SPRAY | CUTANEOUS | Status: DC | PRN
Start: 2013-08-12 — End: 2013-08-13
  Administered 2013-08-12: 1 via TOPICAL
  Filled 2013-08-12: qty 56

## 2013-08-12 MED ORDER — WITCH HAZEL-GLYCERIN EX PADS
1.0000 | MEDICATED_PAD | CUTANEOUS | Status: DC | PRN
Start: 2013-08-12 — End: 2013-08-13

## 2013-08-12 MED ORDER — TETANUS-DIPHTH-ACELL PERTUSSIS 5-2.5-18.5 LF-MCG/0.5 IM SUSP
0.5000 mL | Freq: Once | INTRAMUSCULAR | Status: AC
  Administered 2013-08-12: 0.5 mL via INTRAMUSCULAR

## 2013-08-12 MED ORDER — SIMETHICONE 80 MG PO CHEW: 80.0000 mg | CHEWABLE_TABLET | ORAL | Status: DC | PRN

## 2013-08-12 MED ORDER — IBUPROFEN 600 MG PO TABS
600.0000 mg | ORAL_TABLET | Freq: Four times a day (QID) | ORAL | Status: DC
  Administered 2013-08-12 – 2013-08-13 (×6): 600 mg via ORAL
  Filled 2013-08-12 (×6): qty 1

## 2013-08-12 MED ORDER — DIBUCAINE 1 % RE OINT: 1.0000 "application " | TOPICAL_OINTMENT | RECTAL | Status: DC | PRN

## 2013-08-12 NOTE — Anesthesia Postprocedure Evaluation (Signed)
Anesthesia Post Note  Patient: Christine Sheppard  Procedure(s) Performed: * No procedures listed *  Anesthesia type: Epidural  Patient location: Mother/Baby  Post pain: Pain level controlled  Post assessment: Post-op Vital signs reviewed  Last Vitals:  Filed Vitals:   08/12/13 0804  BP: 107/65  Pulse: 92  Temp: 36.8 C  Resp: 18    Post vital signs: Reviewed  Level of consciousness:alert  Complications: No apparent anesthesia complications

## 2013-08-12 NOTE — Progress Notes (Signed)
Patient given VIS for MMR and Tdap vaccines.

## 2013-08-12 NOTE — Lactation Note (Signed)
This note was copied from the chart of Christine Sheppard. Lactation Consultation Note; Initial visit with mom, She reports that baby has not been feeding well. Takes a few sucks but she is not getting anything to she stops. Mom reports that she is unable to hand express any Colostrum Discussed DEBP to promote milk supply and parents agreeable. Reviewed setup, cleaning of pump pieces. Mom pumping when I left room. BF brochure given with resources for support after DC. To call for assist prn  Patient Name: Christine Kerry KassHebah Bronder NFAOZ'HToday's Sheppard: 08/12/2013 Reason for consult: Initial assessment;Late preterm infant   Maternal Data Formula Feeding for Exclusion: No Infant to breast within first hour of birth: Yes Has patient been taught Hand Expression?: Yes Does the patient have breastfeeding experience prior to this delivery?: No  Feeding Feeding Type: Bottle Fed - Formula Length of feed: 10 min  LATCH Score/Interventions                      Lactation Tools Discussed/Used WIC Program: No Pump Review: Setup, frequency, and cleaning;Milk Storage Initiated by:: DW Sheppard initiated:: 08/12/13   Consult Status Consult Status: Follow-up Sheppard: 08/13/13 Follow-up type: In-patient    Pamelia HoitDonna D Quintyn Dombek 08/12/2013, 2:31 PM

## 2013-08-13 MED ORDER — MEASLES, MUMPS & RUBELLA VAC ~~LOC~~ INJ
0.5000 mL | INJECTION | Freq: Once | SUBCUTANEOUS | Status: AC
  Administered 2013-08-13: 0.5 mL via SUBCUTANEOUS
  Filled 2013-08-13: qty 0.5

## 2013-08-13 MED ORDER — IBUPROFEN 600 MG PO TABS: 600.0000 mg | ORAL_TABLET | Freq: Four times a day (QID) | ORAL | Status: AC | PRN

## 2013-08-13 NOTE — Discharge Summary (Signed)
Obstetric Discharge Summary Reason for Admission: induction of labor d/t 2 prolonged fetal bradycardic episodes while being observed for r/o ROM Prenatal Procedures: none Intrapartum Procedures: spontaneous vaginal delivery Postpartum Procedures: mmr Complications-Operative and Postpartum: none Eating, drinking, voiding, ambulating well.  +flatus.  Lochia and pain wnl.  Denies dizziness, lightheadedness, or sob. No complaints.   Hospital Course: Christine Sheppard is a 33 y.o. G23P1001 female here from [place] for birth, admited at 38.2wks after 2 prolonged fetal bradycardic episodes were noted while she was being observed for r/o ROM.  Her membranes were intact, and IOL process was initiated d/t NRFHR. After cervical foley bulb ripening, she progressed well on pitocin and went on to birth a viable female infant w/o complications. Her pp course has been uncomplicated.  By PPD#1 she is doing well and is deemed to have received the full benefit of her hospital stay.  Filed Vitals:   08/13/13 0600  BP: 95/50  Pulse: 63  Temp: 97.3 F (36.3 C)  Resp: 16   H/H: Lab Results  Component Value Date/Time   HGB 12.6 08/10/2013  4:10 PM   HCT 36.9 08/10/2013  4:10 PM    Physical Exam: General: alert, cooperative and no distress Abdomen/Uterine Fundus: Appropriately tender, non-distended, FF @ U-2 Incision: n/a Lochia: appropriate Extremities: No evidence of DVT seen on physical exam. Negative Homan's sign, no cords, calf tenderness, or significant calf/ankle edema   Discharge Diagnoses: Term Pregnancy-delivered  Discharge Information: Date: 10/23/2010 Activity: pelvic rest Diet: routine  Medications: PNV and Ibuprofen Breast feeding: Yes Contraception: undecided Circumcision: n/a Condition: stable Instructions: refer to handout Discharge to: home  Infant: Home with mother  Follow-up Information   Follow up with [facility] in [place] Today. (4-6 weeks for your postpartum visit)     Plans to return  to [place] as soon as paperwork is completed for infant.   Tawnya Crook, CNM, [facility] 08/13/2013,7:26 AM

## 2013-08-13 NOTE — Discharge Instructions (Signed)
Postpartum Care After Vaginal Delivery °After you deliver your newborn (postpartum period), the usual stay in the hospital is 24 72 hours. If there were problems with your labor or delivery, or if you have other medical problems, you might be in the hospital longer.  °While you are in the hospital, you will receive help and instructions on how to care for yourself and your newborn during the postpartum period.  °While you are in the hospital: °· Be sure to tell your nurses if you have pain or discomfort, as well as where you feel the pain and what makes the pain worse. °· If you had an incision made near your vagina (episiotomy) or if you had some tearing during delivery, the nurses may put ice packs on your episiotomy or tear. The ice packs may help to reduce the pain and swelling. °· If you are breastfeeding, you may feel uncomfortable contractions of your uterus for a couple of weeks. This is normal. The contractions help your uterus get back to normal size. °· It is normal to have some bleeding after delivery. °· For the first 1 3 days after delivery, the flow is red and the amount may be similar to a period. °· It is common for the flow to start and stop. °· In the first few days, you may pass some small clots. Let your nurses know if you begin to pass large clots or your flow increases. °· Do not  flush blood clots down the toilet before having the nurse look at them. °· During the next 3 10 days after delivery, your flow should become more watery and pink or brown-tinged in color. °· Ten to fourteen days after delivery, your flow should be a small amount of yellowish-white discharge. °· The amount of your flow will decrease over the first few weeks after delivery. Your flow may stop in 6 8 weeks. Most women have had their flow stop by 12 weeks after delivery. °· You should change your sanitary pads frequently. °· Wash your hands thoroughly with soap and water for at least 20 seconds after changing pads, using  the toilet, or before holding or feeding your newborn. °· You should feel like you need to empty your bladder within the first 6 8 hours after delivery. °· In case you become weak, lightheaded, or faint, call your nurse before you get out of bed for the first time and before you take a shower for the first time. °· Within the first few days after delivery, your breasts may begin to feel tender and full. This is called engorgement. Breast tenderness usually goes away within 48 72 hours after engorgement occurs. You may also notice milk leaking from your breasts. If you are not breastfeeding, do not stimulate your breasts. Breast stimulation can make your breasts produce more milk. °· Spending as much time as possible with your newborn is very important. During this time, you and your newborn can feel close and get to know each other. Having your newborn stay in your room (rooming in) will help to strengthen the bond with your newborn.  It will give you time to get to know your newborn and become comfortable caring for your newborn. °· Your hormones change after delivery. Sometimes the hormone changes can temporarily cause you to feel sad or tearful. These feelings should not last more than a few days. If these feelings last longer than that, you should talk to your caregiver. °· If desired, talk to your caregiver about   methods of family planning or contraception. °· Talk to your caregiver about immunizations. Your caregiver may want you to have the following immunizations before leaving the hospital: °· Tetanus, diphtheria, and pertussis (Tdap) or tetanus and diphtheria (Td) immunization. It is very important that you and your family (including grandparents) or others caring for your newborn are up-to-date with the Tdap or Td immunizations. The Tdap or Td immunization can help protect your newborn from getting ill. °· Rubella immunization. °· Varicella (chickenpox) immunization. °· Influenza immunization. You should  receive this annual immunization if you did not receive the immunization during your pregnancy. °Document Released: 01/25/2007 Document Revised: 12/23/2011 Document Reviewed: 11/25/2011 °ExitCare® Patient Information ©2014 [employer]. ° °Breastfeeding °Deciding to breastfeed is one of the best choices you can make for you and your baby. A change in hormones during pregnancy causes your breast tissue to grow and increases the number and size of your milk ducts. These hormones also allow proteins, sugars, and fats from your blood supply to make breast milk in your milk-producing glands. Hormones prevent breast milk from being released before your baby is born as well as prompt milk flow after birth. Once breastfeeding has begun, thoughts of your baby, as well as his or her sucking or crying, can stimulate the release of milk from your milk-producing glands.  °BENEFITS OF BREASTFEEDING °For Your Baby °· Your first milk (colostrum) helps your baby's digestive system function better.   °· There are antibodies in your milk that help your baby fight off infections.   °· Your baby has a lower incidence of asthma, allergies, and sudden infant death syndrome.   °· The nutrients in breast milk are better for your baby than infant formulas and are designed uniquely for your baby's needs.   °· Breast milk improves your baby's brain development.   °· Your baby is less likely to develop other conditions, such as childhood obesity, asthma, or type 2 diabetes mellitus.   °For You  °· Breastfeeding helps to create a very special bond between you and your baby.   °· Breastfeeding is convenient. Breast milk is always available at the correct temperature and costs nothing.   °· Breastfeeding helps to burn calories and helps you lose the weight gained during pregnancy.   °· Breastfeeding makes your uterus contract to its prepregnancy size faster and slows bleeding (lochia) after you give birth.   °· Breastfeeding helps to lower your  risk of developing type 2 diabetes mellitus, osteoporosis, and breast or ovarian cancer later in life. °SIGNS THAT YOUR BABY IS HUNGRY °Early Signs of Hunger  °· Increased alertness or activity. °· Stretching. °· Movement of the head from side to side. °· Movement of the head and opening of the mouth when the corner of the mouth or cheek is stroked (rooting). °· Increased sucking sounds, smacking lips, cooing, sighing, or squeaking. °· Hand-to-mouth movements. °· Increased sucking of fingers or hands. °Late Signs of Hunger °· Fussing. °· Intermittent crying. °Extreme Signs of Hunger °Signs of extreme hunger will require calming and consoling before your baby will be able to breastfeed successfully. Do not wait for the following signs of extreme hunger to occur before you initiate breastfeeding:   °· Restlessness. °· A loud, strong cry. °·  Screaming. °BREASTFEEDING BASICS °Breastfeeding Initiation °· Find a comfortable place to sit or lie down, with your neck and back well supported. °· Place a pillow or rolled up blanket under your baby to bring him or her to the level of your breast (if you are seated). Nursing pillows are   specially designed to help support your arms and your baby while you breastfeed. °· Make sure that your baby's abdomen is facing your abdomen.   °· Gently massage your breast. With your fingertips, massage from your chest wall toward your nipple in a circular motion. This encourages milk flow. You may need to continue this action during the feeding if your milk flows slowly. °· Support your breast with 4 fingers underneath and your thumb above your nipple. Make sure your fingers are well away from your nipple and your baby's mouth.   °· Stroke your baby's lips gently with your finger or nipple.   °· When your baby's mouth is open wide enough, quickly bring your baby to your breast, placing your entire nipple and as much of the colored area around your nipple (areola) as possible into your baby's  mouth.   °· More areola should be visible above your baby's upper lip than below the lower lip.   °· Your baby's tongue should be between his or her lower gum and your breast.   °· Ensure that your baby's mouth is correctly positioned around your nipple (latched). Your baby's lips should create a seal on your breast and be turned out (everted). °· It is common for your baby to suck about 2 3 minutes in order to start the flow of breast milk. °Latching °Teaching your baby how to latch on to your breast properly is very important. An improper latch can cause nipple pain and decreased milk supply for you and poor weight gain in your baby. Also, if your baby is not latched onto your nipple properly, he or she may swallow some air during feeding. This can make your baby fussy. Burping your baby when you switch breasts during the feeding can help to get rid of the air. However, teaching your baby to latch on properly is still the best way to prevent fussiness from swallowing air while breastfeeding. °Signs that your baby has successfully latched on to your nipple:    °· Silent tugging or silent sucking, without causing you pain.   °· Swallowing heard between every 3 4 sucks.   °·  Muscle movement above and in front of his or her ears while sucking.   °Signs that your baby has not successfully latched on to nipple:  °· Sucking sounds or smacking sounds from your baby while breastfeeding. °· Nipple pain. °If you think your baby has not latched on correctly, slip your finger into the corner of your baby's mouth to break the suction and place it between your baby's gums. Attempt breastfeeding initiation again. °Signs of Successful Breastfeeding °Signs from your baby:   °· A gradual decrease in the number of sucks or complete cessation of sucking.   °· Falling asleep.   °· Relaxation of his or her body.   °· Retention of a small amount of milk in his or her mouth.   °· Letting go of your breast by himself or herself. °Signs  from you: °· Breasts that have increased in firmness, weight, and size 1 3 hours after feeding.   °· Breasts that are softer immediately after breastfeeding. °· Increased milk volume, as well as a change in milk consistency and color by the 5th day of breastfeeding.   °· Nipples that are not sore, cracked, or bleeding. °Signs That Your Baby is Getting Enough Milk °· Wetting at least 3 diapers in a 24-hour period. The urine should be clear and pale yellow by age 5 days. °· At least 3 stools in a 24-hour period by age 5 days. The stool   should be soft and yellow. °· At least 3 stools in a 24-hour period by age 7 days. The stool should be seedy and yellow. °· No loss of weight greater than 10% of birth weight during the first 3 days of age. °· Average weight gain of 4 7 ounces (120 210 mL) per week after age 4 days. °· Consistent daily weight gain by age 5 days, without weight loss after the age of 2 weeks. °After a feeding, your baby may spit up a small amount. This is common. °BREASTFEEDING FREQUENCY AND DURATION °Frequent feeding will help you make more milk and can prevent sore nipples and breast engorgement. Breastfeed when you feel the need to reduce the fullness of your breasts or when your baby shows signs of hunger. This is called "breastfeeding on demand." Avoid introducing a pacifier to your baby while you are working to establish breastfeeding (the first 4 6 weeks after your baby is born). After this time you may choose to use a pacifier. Research has shown that pacifier use during the first year of a baby's life decreases the risk of sudden infant death syndrome (SIDS). °Allow your baby to feed on each breast as long as he or she wants. Breastfeed until your baby is finished feeding. When your baby unlatches or falls asleep while feeding from the first breast, offer the second breast. Because newborns are often sleepy in the first few weeks of life, you may need to awaken your baby to get him or her to  feed. °Breastfeeding times will vary from baby to baby. However, the following rules can serve as a guide to help you ensure that your baby is properly fed: °· Newborns (babies 4 weeks of age or younger) may breastfeed every 1 3 hours. °· Newborns should not go longer than 3 hours during the day or 5 hours during the night without breastfeeding. °· You should breastfeed your baby a minimum of 8 times in a 24-hour period until you begin to introduce solid foods to your baby at around 6 months of age. °BREAST MILK PUMPING °Pumping and storing breast milk allows you to ensure that your baby is exclusively fed your breast milk, even at times when you are unable to breastfeed. This is especially important if you are going back to work while you are still breastfeeding or when you are not able to be present during feedings. Your lactation consultant can give you guidelines on how long it is safe to store breast milk.  °A breast pump is a machine that allows you to pump milk from your breast into a sterile bottle. The pumped breast milk can then be stored in a refrigerator or freezer. Some breast pumps are operated by hand, while others use electricity. Ask your lactation consultant which type will work best for you. Breast pumps can be purchased, but some hospitals and breastfeeding support groups lease breast pumps on a monthly basis. A lactation consultant can teach you how to hand express breast milk, if you prefer not to use a pump.  °CARING FOR YOUR BREASTS WHILE YOU BREASTFEED °Nipples can become dry, cracked, and sore while breastfeeding. The following recommendations can help keep your breasts moisturized and healthy: °· Avoid using soap on your nipples.   °· Wear a supportive bra. Although not required, special nursing bras and tank tops are designed to allow access to your breasts for breastfeeding without taking off your entire bra or top. Avoid wearing underwire style bras or extremely tight bras. °·   Air dry  your nipples for 3 4 minutes after each feeding.   °· Use only cotton bra pads to absorb leaked breast milk. Leaking of breast milk between feedings is normal.   °· Use lanolin on your nipples after breastfeeding. Lanolin helps to maintain your skin's normal moisture barrier. If you use pure lanolin you do not need to wash it off before feeding your baby again. Pure lanolin is not toxic to your baby. You may also hand express a few drops of breast milk and gently massage that milk into your nipples and allow the milk to air dry. °In the first few weeks after giving birth, some women experience extremely full breasts (engorgement). Engorgement can make your breasts feel heavy, warm, and tender to the touch. Engorgement peaks within 3 5 days after you give birth. The following recommendations can help ease engorgement: °· Completely empty your breasts while breastfeeding or pumping. You may want to start by applying warm, moist heat (in the shower or with warm water-soaked hand towels) just before feeding or pumping. This increases circulation and helps the milk flow. If your baby does not completely empty your breasts while breastfeeding, pump any extra milk after he or she is finished. °· Wear a snug bra (nursing or regular) or tank top for 1 2 days to signal your body to slightly decrease milk production. °· Apply ice packs to your breasts, unless this is too uncomfortable for you. °· Make sure that your baby is latched on and positioned properly while breastfeeding. °If engorgement persists after 48 hours of following these recommendations, contact your health care provider or a lactation consultant. °OVERALL HEALTH CARE RECOMMENDATIONS WHILE BREASTFEEDING °· Eat healthy foods. Alternate between meals and snacks, eating 3 of each per day. Because what you eat affects your breast milk, some of the foods may make your baby more irritable than usual. Avoid eating these foods if you are sure that they are negatively  affecting your baby. °· Drink milk, fruit juice, and water to satisfy your thirst (about 10 glasses a day).   °· Rest often, relax, and continue to take your prenatal vitamins to prevent fatigue, stress, and anemia. °· Continue breast self-awareness checks. °· Avoid chewing and smoking tobacco. °· Avoid alcohol and drug use. °Some medicines that may be harmful to your baby can pass through breast milk. It is important to ask your health care provider before taking any medicine, including all over-the-counter and prescription medicine as well as vitamin and herbal supplements. °It is possible to become pregnant while breastfeeding. If birth control is desired, ask your health care provider about options that will be safe for your baby. °SEEK MEDICAL CARE IF:  °· You feel like you want to stop breastfeeding or have become frustrated with breastfeeding. °· You have painful breasts or nipples. °· Your nipples are cracked or bleeding. °· Your breasts are red, tender, or warm. °· You have a swollen area on either breast. °· You have a fever or chills. °· You have nausea or vomiting. °· You have drainage other than breast milk from your nipples. °· Your breasts do not become full before feedings by the 5th day after you give birth. °· You feel sad and depressed. °· Your baby is too sleepy to eat well. °· Your baby is having trouble sleeping.   °· Your baby is wetting less than 3 diapers in a 24-hour period. °· Your baby has less than 3 stools in a 24-hour period. °· Your baby's skin or the   white part of his or her eyes becomes yellow.   °· Your baby is not gaining weight by 5 days of age. °SEEK IMMEDIATE MEDICAL CARE IF:  °· Your baby is overly tired (lethargic) and does not want to wake up and feed. °· Your baby develops an unexplained fever. °Document Released: 03/30/2005 Document Revised: 11/30/2012 Document Reviewed: 09/21/2012 °[employer]® Patient Information ©2014 [employer]. ° ° °

## 2013-08-14 ENCOUNTER — Ambulatory Visit: Payer: Self-pay

## 2013-08-14 NOTE — Progress Notes (Signed)
Post discharge chart review completed.  

## 2013-08-14 NOTE — Lactation Note (Signed)
This note was copied from the chart of Christine Sheppard. Lactation Consultation Note  Patient Name: Christine Kerry KassHebah Egelston NWGNF'AToday's Date: 08/14/2013 Reason for consult: Follow-up assessment;Difficult latch;Infant < 6lbs  Visited with Christine Sheppard and Christine Sheppard, baby at 3462 hrs old.  Christine Sheppard has been primarily pumping and bottle feeding as baby would not latch.  Christine Sheppard has large, heavy breasts with short nipple shafts.  Areola dense and nipple inverts when breast compressed to help baby latch.  Initiated using the manual breast pump to evert nipple, and initiated a 20 mm nipple shield.  Showed Christine Sheppard how to use, and care for nipple shield.  Talked about importance of baby being skin to skin, and Christine Sheppard sitting up with good pillow support.  Baby needed a little coaxing using a few ml of 22 cal formula (curved tip syringe) behind the nipple shield.  After a few minutes suckling on the nipple shield, we tried a manual pump to pull nipple out.  Transitional milk easily expressed, which made Christine Sheppard very happy.  Assisted with latching baby in football hold.  Baby able to suck/swallow everting nipple into nipple shield, latch score-9.  Following 20 minutes of breast feeding, milk noted in shield.  Christine Sheppard fed baby her milk using her finger.  Taught Christine Sheppard how to burp, and bottle feed baby her supplement (20 ml), while Christine Sheppard use the DEBP for 15 minutes.  I reassured Christine Sheppard that I felt her milk was starting to increase, milk ducts palpated bilaterally.  To call for help with nipple shield, and positioning prn.     Lactation Tools Discussed/Used Tools: Shells;Nipple [name];Pump Nipple shield size: 20 Shell Type: Inverted Breast pump type: Manual Initiated by:: [name] Date initiated:: 08/14/13   Consult Status Consult Status: Follow-up Date: 08/15/13 Follow-up type: In-patient    [name] 08/14/2013, 3:06 PM

## 2013-08-15 ENCOUNTER — Ambulatory Visit: Payer: Self-pay

## 2013-08-15 NOTE — Lactation Note (Signed)
This note was copied from the chart of Christine Louellen Mapel. Lactation Consultation Note: Follow up visit with mom of infant <6lbs Mom's breasts are full today. Reports that baby had formula at last feeding.Encouraged to nurse or pump q 2-3 hours to prevent engorgement. Mom reports that manual pump works better for her. Is using #20 NS. Suggested trying without it but mom wants to put it on now. Baby nursed on both breasts with dad massaging breasts during the feeding. Both breasts are softer. Offered DEBP rental but mom wants to use manual pump. Offered OP appointment but parents don't want to make appointment now. To call with questions/if needs appointment.No questions at present.  Patient Name: Christine Kerry KassHebah Leavy VHQIO'NToday's Date: 08/15/2013 Reason for consult: Follow-up assessment   Maternal Data    Feeding Feeding Type: Breast Fed Length of feed: 30 min  LATCH Score/Interventions Latch: Grasps breast easily, tongue down, lips flanged, rhythmical sucking.  Audible Swallowing: Spontaneous and intermittent  Type of Nipple: Flat  Comfort (Breast/Nipple): Filling, red/small blisters or bruises, mild/mod discomfort Intervention(s): Ice;Hand expression  Problem noted: Filling Interventions (Filling): Frequent nursing;Hand pump  Hold (Positioning): Assistance needed to correctly position infant at breast and maintain latch. Intervention(s): Breastfeeding basics reviewed  LATCH Score: 7  Lactation Tools Discussed/Used Tools: Nipple Shields Nipple shield size: 20 WIC Program: No   Consult Status Consult Status: Complete    Pamelia HoitDonna D Larken Urias 08/15/2013, 8:51 AM

## 2014-02-12 ENCOUNTER — Encounter (HOSPITAL_COMMUNITY): Payer: Self-pay | Admitting: *Deleted
